# Patient Record
Sex: Male | Born: 1990 | Race: Black or African American | Hispanic: No | Marital: Single | Smoking: Current every day smoker
Health system: Southern US, Community
[De-identification: ages and names within clinical notes are randomized; demographics above are authoritative.]

## PROBLEM LIST (undated history)

## (undated) HISTORY — PX: BACK SURGERY: SHX140

---

## 2015-01-27 ENCOUNTER — Emergency Department (HOSPITAL_COMMUNITY): Payer: BC Managed Care – PPO

## 2015-01-27 ENCOUNTER — Emergency Department (HOSPITAL_COMMUNITY)
Admission: EM | Admit: 2015-01-27 | Discharge: 2015-01-27 | Disposition: A | Discharge status: Home / Self Care | Payer: BC Managed Care – PPO | Attending: Emergency Medicine | Admitting: Emergency Medicine

## 2015-01-27 ENCOUNTER — Encounter (HOSPITAL_COMMUNITY): Payer: Self-pay

## 2015-01-27 DIAGNOSIS — R109 Unspecified abdominal pain: Secondary | ICD-10-CM

## 2015-01-27 DIAGNOSIS — R1031 Right lower quadrant pain: Secondary | ICD-10-CM | POA: Diagnosis present

## 2015-01-27 DIAGNOSIS — Z87891 Personal history of nicotine dependence: Secondary | ICD-10-CM | POA: Diagnosis not present

## 2015-01-27 DIAGNOSIS — N201 Calculus of ureter: Secondary | ICD-10-CM | POA: Diagnosis not present

## 2015-01-27 LAB — COMPREHENSIVE METABOLIC PANEL
ALT: 49 U/L (ref 17–63)
AST: 29 U/L (ref 15–41)
Albumin: 3.7 g/dL (ref 3.5–5.0)
Alkaline Phosphatase: 58 U/L (ref 38–126)
Anion gap: 13 (ref 5–15)
BUN: 14 mg/dL (ref 6–20)
CHLORIDE: 102 mmol/L (ref 101–111)
CO2: 24 mmol/L (ref 22–32)
Calcium: 9.1 mg/dL (ref 8.9–10.3)
Creatinine, Ser: 1.23 mg/dL (ref 0.61–1.24)
Glucose, Bld: 123 mg/dL — ABNORMAL HIGH (ref 65–99)
POTASSIUM: 3.5 mmol/L (ref 3.5–5.1)
SODIUM: 139 mmol/L (ref 135–145)
Total Bilirubin: 0.4 mg/dL (ref 0.3–1.2)
Total Protein: 6.7 g/dL (ref 6.5–8.1)

## 2015-01-27 LAB — LIPASE, BLOOD: LIPASE: 28 U/L (ref 22–51)

## 2015-01-27 LAB — CBC
HEMATOCRIT: 43.8 % (ref 39.0–52.0)
Hemoglobin: 14.8 g/dL (ref 13.0–17.0)
MCH: 31.1 pg (ref 26.0–34.0)
MCHC: 33.8 g/dL (ref 30.0–36.0)
MCV: 92 fL (ref 78.0–100.0)
Platelets: 181 10*3/uL (ref 150–400)
RBC: 4.76 MIL/uL (ref 4.22–5.81)
RDW: 12.4 % (ref 11.5–15.5)
WBC: 5.8 10*3/uL (ref 4.0–10.5)

## 2015-01-27 MED ORDER — OXYCODONE-ACETAMINOPHEN 5-325 MG PO TABS: 1.0000 | ORAL_TABLET | Freq: Four times a day (QID) | ORAL | Status: DC | PRN

## 2015-01-27 MED ORDER — TAMSULOSIN HCL 0.4 MG PO CAPS: 0.4000 mg | ORAL_CAPSULE | Freq: Every day | ORAL | Status: DC

## 2015-01-27 MED ORDER — HYDROMORPHONE HCL 1 MG/ML IJ SOLN
1.0000 mg | Freq: Once | INTRAMUSCULAR | Status: AC
  Administered 2015-01-27: 1 mg via INTRAVENOUS
  Filled 2015-01-27: qty 1

## 2015-01-27 MED ORDER — KETOROLAC TROMETHAMINE 30 MG/ML IJ SOLN
30.0000 mg | Freq: Once | INTRAMUSCULAR | Status: AC
  Administered 2015-01-27: 30 mg via INTRAVENOUS
  Filled 2015-01-27: qty 1

## 2015-01-27 NOTE — ED Notes (Signed)
Per pt he started having RLQ pain around 4 am. Pt states he was sleeping when the pain woke him up. Pt took equate but did not have any pain relief. No vomiting or diarrhea.

## 2015-01-27 NOTE — ED Notes (Signed)
Pt attempted to provide urine specimen, was unable to

## 2015-01-27 NOTE — ED Notes (Signed)
Patient transported to CT 

## 2015-01-27 NOTE — Discharge Instructions (Signed)
Return here as needed.  Follow up with the urologist provided.  Increase your fluid intake °

## 2015-01-27 NOTE — ED Provider Notes (Signed)
CSN: [id number]     Arrival date & time 01/27/15  0551 History   First MD Initiated Contact with Patient 01/27/15 [phone]     Chief Complaint  Patient presents with  . Abdominal Pain     (Consider location/radiation/quality/duration/timing/severity/associated sxs/prior Treatment) HPI Patient presents to the emergency department with sudden onset right flank pain that started just before a.m. the patient states that he was sleeping and the pain awoke him.  He states that he did not have any nausea, vomiting, diarrhea, chest pain, shortness of breath, fever, dysuria, incontinence, weakness, dizziness, blurred vision, headache, or syncope.  The patient states the pain did seem to radiate to his back somewhat.  Patient did not take any medications prior to arrival History reviewed. No pertinent past medical history. Past Surgical History  Procedure Laterality Date  . Back surgery      Scoliosis    No family history on file. Social History  Substance Use Topics  . Smoking status: Former Smoker    Quit date: 01/27/2012  . Smokeless tobacco: Never Used  . Alcohol Use: No    Review of Systems  All other systems negative except as documented in the HPI. All pertinent positives and negatives as reviewed in the HPI.   Allergies  Pollen extract  Home Medications   Prior to Admission medications   Not on File   BP 136/88 mmHg  Pulse 71  Temp(Src) 98.6 F (37 C) (Oral)  Resp 20  SpO2 98% Physical Exam  Constitutional: He is oriented to person, place, and time. He appears well-developed and well-nourished. No distress.  HENT:  Head: Normocephalic and atraumatic.  Mouth/Throat: Oropharynx is clear and moist.  Eyes: Pupils are equal, round, and reactive to light.  Neck: Normal range of motion. Neck supple.  Cardiovascular: Normal rate, regular rhythm and normal heart sounds.  Exam reveals no gallop and no friction rub.   No murmur heard. Pulmonary/Chest: Effort normal and breath  sounds normal. No respiratory distress. He has no wheezes.  Abdominal: Soft. Bowel sounds are normal. He exhibits no distension. There is no tenderness.  Neurological: He is alert and oriented to person, place, and time. He exhibits normal muscle tone. Coordination normal.  Skin: Skin is warm and dry. No rash noted. No erythema.  Psychiatric: He has a normal mood and affect. His behavior is normal.  Nursing note and vitals reviewed.   ED Course  Procedures (including critical care time) Labs Review Labs Reviewed  COMPREHENSIVE METABOLIC PANEL - Abnormal; Notable for the following:    Glucose, Bld 123 (*)    All other components within normal limits  LIPASE, BLOOD  CBC    Imaging Review Ct Renal Stone Study  01/27/2015   CLINICAL DATA:  24 year old male with right flank/ lower quadrant pain. Difficulty urinating. Initial encounter.  EXAM: CT ABDOMEN AND PELVIS WITHOUT CONTRAST  TECHNIQUE: Multidetector CT imaging of the abdomen and pelvis was performed following the standard protocol without IV contrast.  COMPARISON:  None.  FINDINGS: 4 mm distal right ureteral obstructing stone located 2 cm proximal to the right ureteral vesicle junction causing mild to slightly moderate right-sided hydroureteronephrosis.  No extra luminal bowel inflammatory process, free fluid or free air. Specifically, no inflammation surrounds the appendix.  Two small calcified gallstones. By CT, gallbladder does not appear inflamed.  Suggestion of mild fatty infiltration of the liver.  Taking into account limitation by non contrast imaging, no focal worrisome hepatic, splenic, pancreatic, renal or adrenal lesion. Lack  of contrast and streak artifact limit evaluation.  Scoliosis convex to the right treated with C -D rods incompletely imaged on present exam.  No adenopathy or abdominal aortic aneurysm.  Lung bases are clear.  IMPRESSION: 4 mm distal right ureteral obstructing stone located 2 cm proximal to the right ureteral  vesicle junction causing mild to slightly moderate right-sided hydroureteronephrosis.  No extra luminal bowel inflammatory process, free fluid or free air. Specifically, no inflammation surrounds the appendix.  Two small calcified gallstones. By CT, gallbladder does not appear inflamed.  Suggestion of mild fatty infiltration of the liver.   Electronically Signed   By: Lacy Duverney M.D.   On: 01/27/2015 07:32   I have personally reviewed and evaluated these images and lab results as part of my medical decision-making.  Patient was given medications that have reduced his pain significantly.  He does not have any current pain at this time.  He will be referred to urology.  Told to return here as needed.  Patient is treated for ureteral stone.  He is advised to increase his fluid intake    Charlestine Night, PA-C 01/29/15 0149  Richardean Canal, MD 01/29/15 1723

## 2016-10-15 IMAGING — CT CT RENAL STONE PROTOCOL
2 of 4 series · 17 of 46 positions shown, 19 images · non-contrast
Comparison: None.

CLINICAL DATA: 24-year-old male with right flank/ lower quadrant
pain. Difficulty urinating. Initial encounter.

EXAM:
CT ABDOMEN AND PELVIS WITHOUT CONTRAST
TECHNIQUE: Multidetector CT imaging of the abdomen and pelvis was performed
following the standard protocol without IV contrast.

[Series 2: stone study 5.0 i30f 1 · axial · 0.79mm/px · z∈[-487,-67]mm · 14 of 94 slices shown, 16 images]
[im 5/94  soft-tissue]
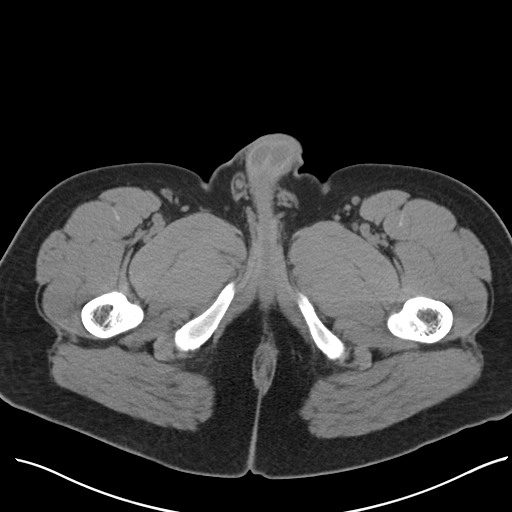
[im 5/94  bone]
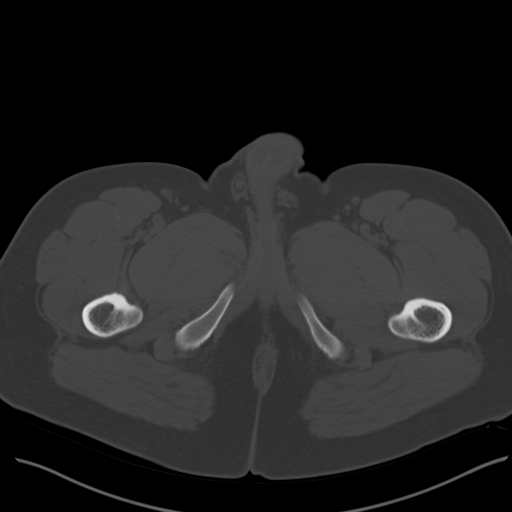
[im 13/94  soft-tissue]
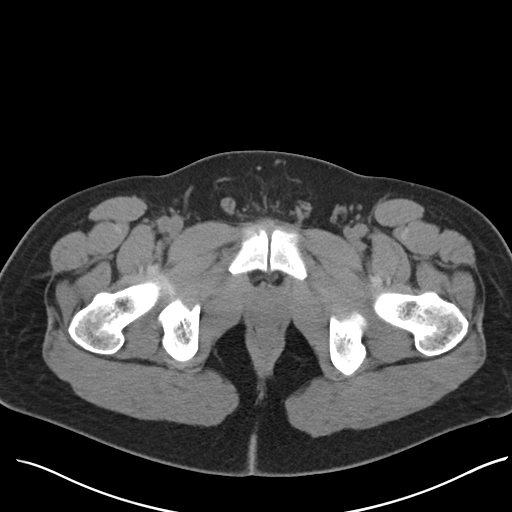
[im 17/94  soft-tissue]
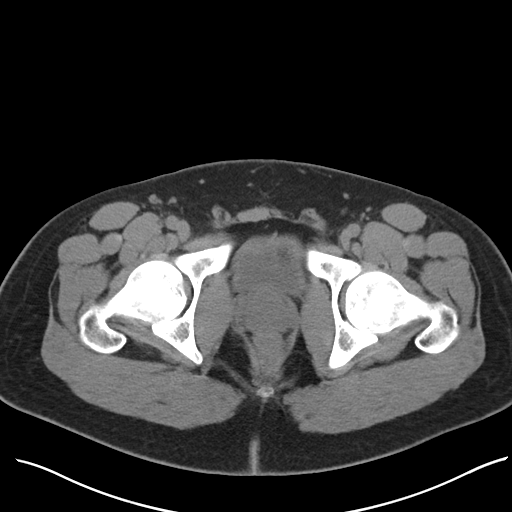
[im 25/94  soft-tissue]
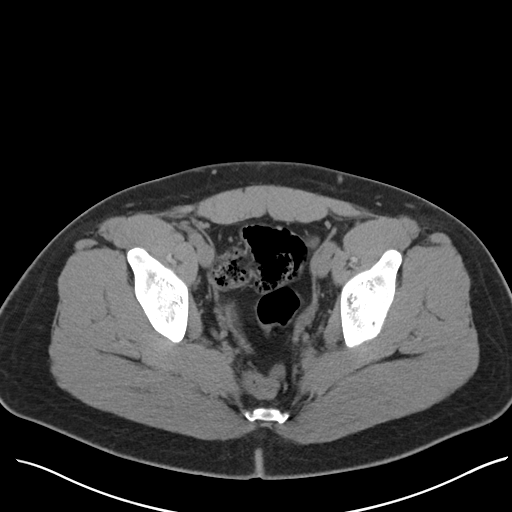
[im 33/94  soft-tissue]
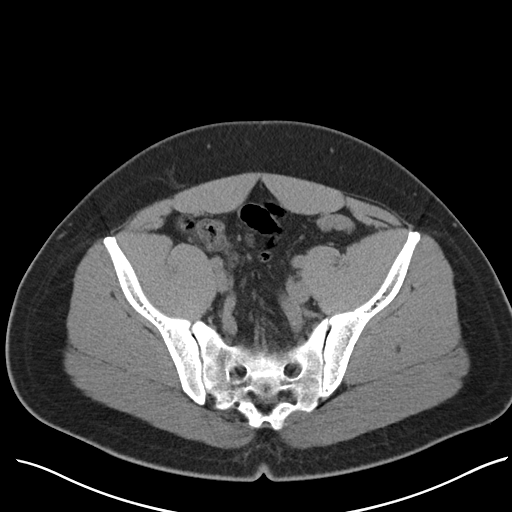
[im 37/94  soft-tissue]
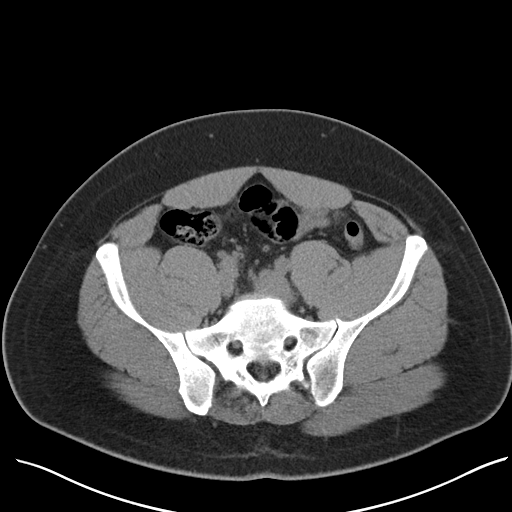
[im 45/94  soft-tissue]
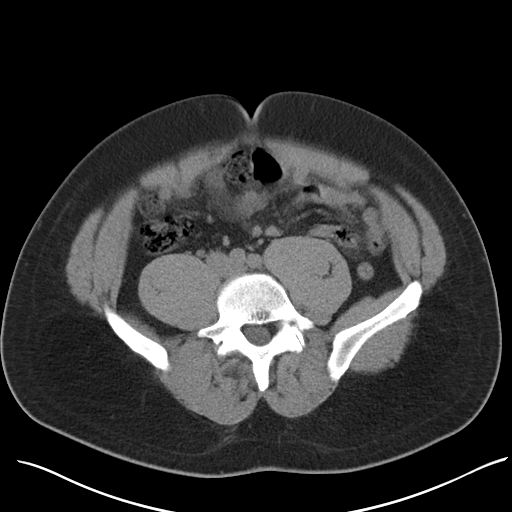
[im 49/94  soft-tissue]
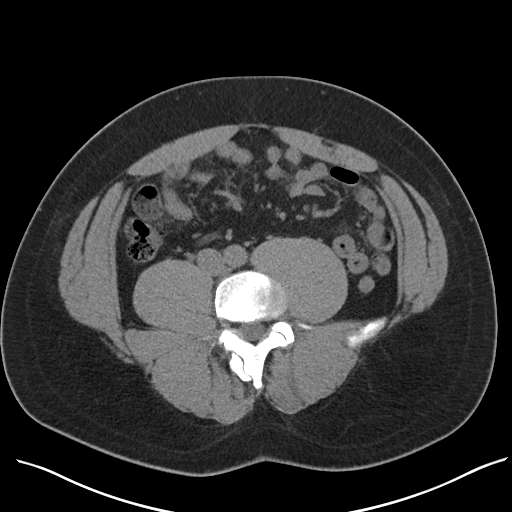
[im 57/94  soft-tissue]
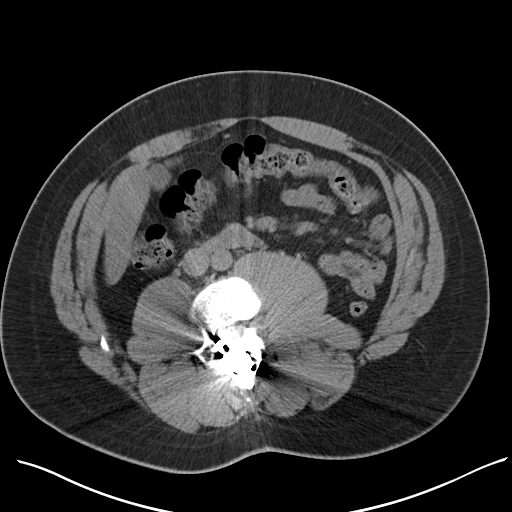
[im 57/94  bone]
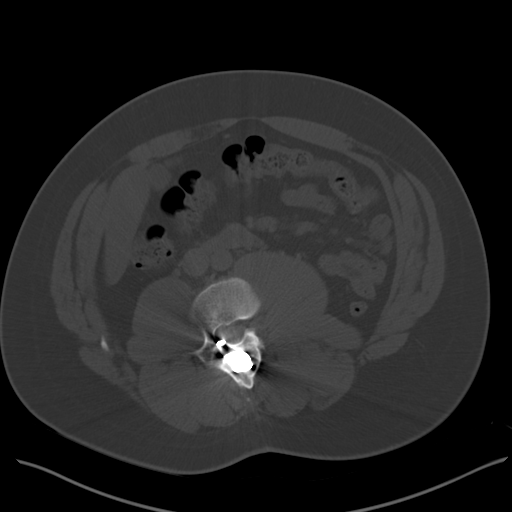
[im 61/94  soft-tissue]
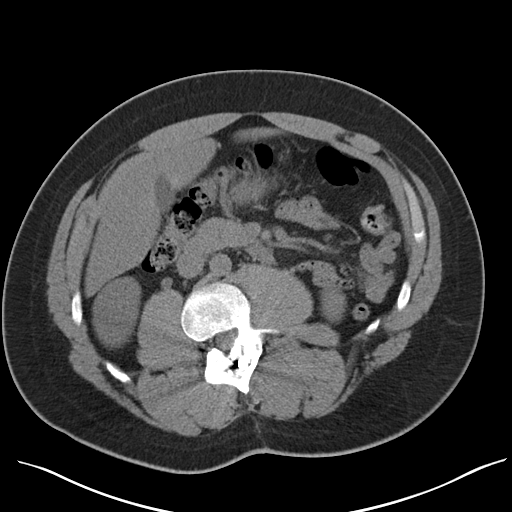
[im 69/94  soft-tissue]
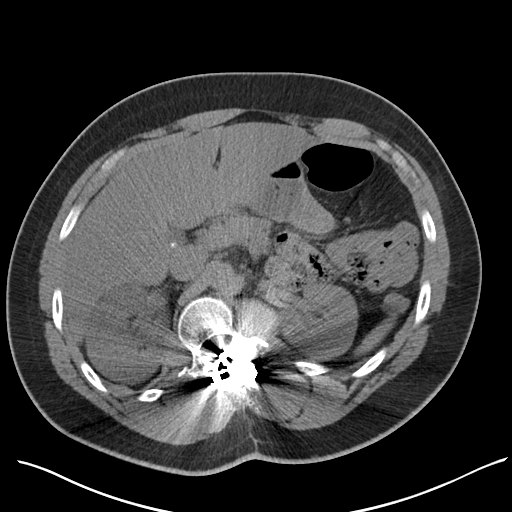
[im 77/94  soft-tissue]
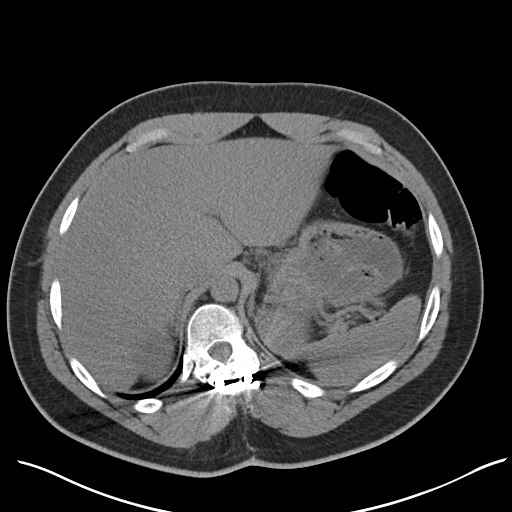
[im 81/94  soft-tissue]
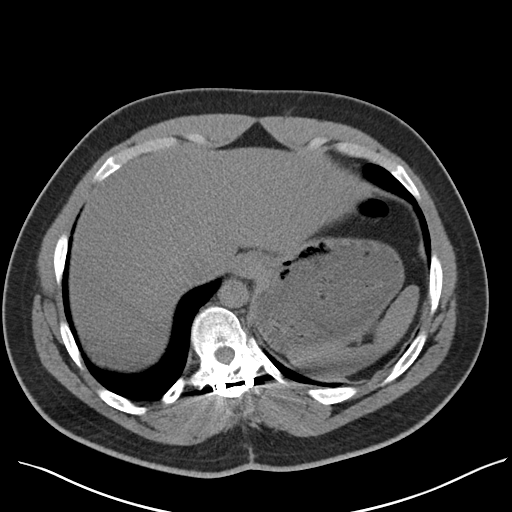
[im 89/94  soft-tissue]
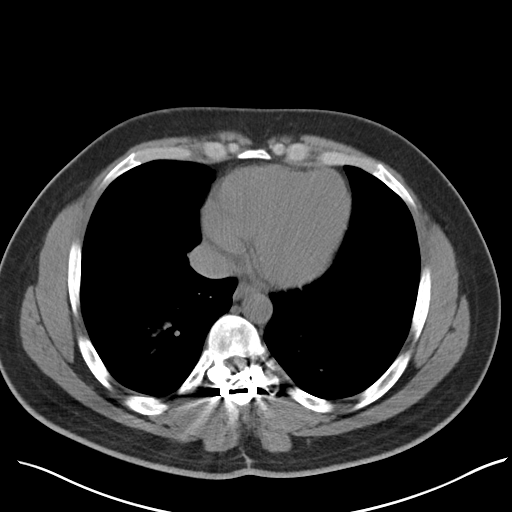

[Series 5: coronal soft tissue · coronal · 0.85mm/px · 3 of 99 slices shown]
[im 33/99  soft-tissue]
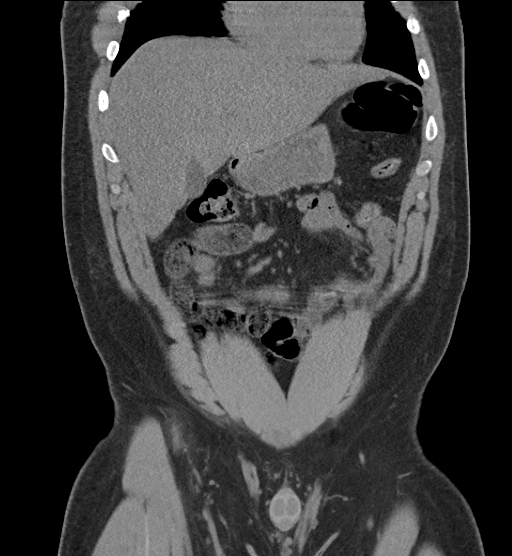
[im 44/99  soft-tissue]
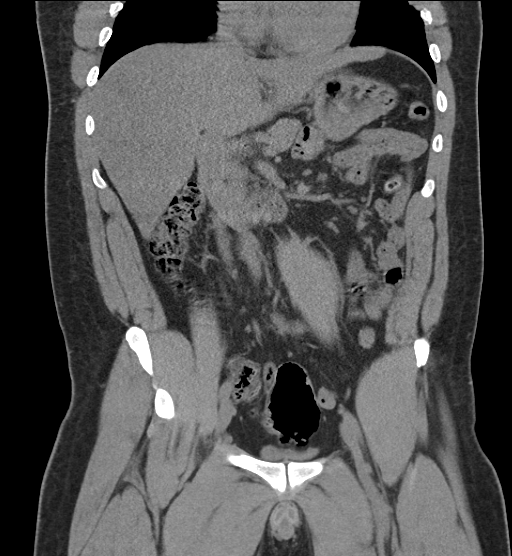
[im 55/99  soft-tissue]
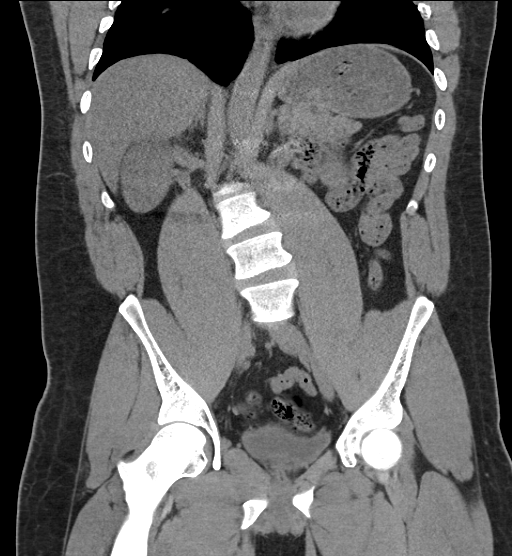

[17 of 46 positions shown; findings below may reference images not displayed]

FINDINGS: 4 mm distal right ureteral obstructing stone located 2 cm proximal
to the right ureteral vesicle junction causing mild to slightly
moderate right-sided hydroureteronephrosis.

No extra luminal bowel inflammatory process, free fluid or free air.
Specifically, no inflammation surrounds the appendix.

Two small calcified gallstones. By CT, gallbladder does not appear
inflamed.

Suggestion of mild fatty infiltration of the liver.

Taking into account limitation by non contrast imaging, no focal
worrisome hepatic, splenic, pancreatic, renal or adrenal lesion.
Lack of contrast and streak artifact limit evaluation.

Scoliosis convex to the right treated with C -D rods incompletely
imaged on present exam.

No adenopathy or abdominal aortic aneurysm.  Lung bases are clear.
IMPRESSION: 4 mm distal right ureteral obstructing stone located 2 cm proximal
to the right ureteral vesicle junction causing mild to slightly
moderate right-sided hydroureteronephrosis.

No extra luminal bowel inflammatory process, free fluid or free air.
Specifically, no inflammation surrounds the appendix.

Two small calcified gallstones. By CT, gallbladder does not appear
inflamed.

Suggestion of mild fatty infiltration of the liver.

## 2017-06-13 ENCOUNTER — Encounter: Payer: Self-pay | Admitting: Physician Assistant

## 2017-06-13 ENCOUNTER — Ambulatory Visit: Payer: Self-pay | Admitting: Physician Assistant

## 2017-06-13 ENCOUNTER — Other Ambulatory Visit: Payer: Self-pay

## 2017-06-13 VITALS — BP 126/82 | HR 74 | Temp 98.5°F | Resp 18 | Ht 73.0 in | Wt 259.0 lb

## 2017-06-13 DIAGNOSIS — R599 Enlarged lymph nodes, unspecified: Secondary | ICD-10-CM

## 2017-06-13 DIAGNOSIS — R0981 Nasal congestion: Secondary | ICD-10-CM

## 2017-06-13 MED ORDER — FLUTICASONE PROPIONATE 50 MCG/ACT NA SUSP: 2.0000 | Freq: Every day | NASAL | 6 refills | Status: DC

## 2017-06-13 NOTE — Progress Notes (Signed)
Chad NickelJoshua Mendez  MRN: [id number] DOB: 06/02/1990  PCP: Patient, No Pcp Per  Chief Complaint  Patient presents with  . Neck Pain    neck is very swollen, hard to talk and swallow ears and jaw also hurts started this morning   . Depression    screening was a 4     Subjective:  Pt presents to clinic for symptoms that started this am.  Woke up with the right side of his neck, throat and ear all hurting.  Some visible swelling this am.  Slightly improved pain due to motrin intake about an hour ago.  He has a scratchy throat.  No left side pain. No congestion or cough.  He did have some cold symptoms last week with the weather change but he felt well from that - past night he was fine.    He works as a Environmental managercomedian.  No injury to neck known.  History is obtained by patient.  Review of Systems  Constitutional: Negative for chills and fever.  HENT: Positive for ear pain (only with swallowing), sore throat, trouble swallowing (pain in neck) and voice change. Negative for congestion, dental problem, ear discharge and postnasal drip.   Musculoskeletal: Negative for myalgias.  Neurological: Negative for facial asymmetry.    There are no active problems to display for this patient.   No current outpatient medications on file prior to visit.   No current facility-administered medications on file prior to visit.     Allergies  Allergen Reactions  . Pollen Extract     History reviewed. No pertinent past medical history. Social History   Social History Narrative  . Not on file   Social History   Tobacco Use  . Smoking status: Current Every Day Smoker    Types: Cigarettes    Last attempt to quit: 01/27/2012    Years since quitting: 5.3  . Smokeless tobacco: Never Used  Substance Use Topics  . Alcohol use: No  . Drug use: Yes    Types: Marijuana    Comment: Last use was 3 days ago    family history is not on file.     Objective:  BP 126/82   Pulse 74   Temp 98.5 F  (36.9 C) (Oral)   Resp 18   Ht 6\' 1"  (1.854 m)   Wt 259 lb (117.5 kg)   SpO2 100%   BMI 34.17 kg/m  Body mass index is 34.17 kg/m.  Physical Exam  Constitutional: He is oriented to person, place, and time and well-developed, well-nourished, and in no distress.  HENT:  Head: Normocephalic and atraumatic.  Right Ear: Hearing, external ear and ear canal normal. A middle ear effusion (mild - serous fluid) is present.  Left Ear: Hearing, tympanic membrane, external ear and ear canal normal.  Nose: Mucosal edema (red and swollen) present.  Mouth/Throat: Uvula is midline, oropharynx is clear and moist and mucous membranes are normal. Normal dentition. No dental abscesses or uvula swelling. No oropharyngeal exudate, posterior oropharyngeal edema or posterior oropharyngeal erythema.  Eyes: Conjunctivae are normal.  Neck: Normal range of motion.  Cardiovascular: Normal rate, regular rhythm and normal heart sounds.  Pulmonary/Chest: Effort normal and breath sounds normal. He has no wheezes.  Lymphadenopathy:       Head (right side): Tonsillar (tender - mild enlargement) adenopathy present. No occipital adenopathy present.       Head (left side): No tonsillar and no occipital adenopathy present.    He has no cervical  adenopathy.       Right cervical: No superficial cervical adenopathy present.      Left cervical: No superficial cervical adenopathy present.       Right: No supraclavicular adenopathy present.       Left: No supraclavicular adenopathy present.  Neurological: He is alert and oriented to person, place, and time. Gait normal.  Skin: Skin is warm and dry. No rash noted.  No lesions - some scarring present  Psychiatric: Mood, memory, affect and judgment normal.    Assessment and Plan :  Nose congestion - Plan: fluticasone (FLONASE) 50 MCG/ACT nasal spray  Swollen lymph nodes   Likely pt is getting ready to develop URI symptoms.  Symptomatic treatment d/w pt.  Follow the lymph  node and RTC in 2 weeks if no improvement.  Benny Lennert PA-C  Primary Care at [facility] 06/13/2017 10:24 AM

## 2017-06-13 NOTE — Patient Instructions (Addendum)
  Mucinex - blue box to help make your nose run - drink a lot of water Motrin - 200mg  - take 4 pills every 8 hours for pain -     IF you received an x-ray today, you will receive an invoice from [facility]. Please contact [facility] at [phone] with questions or concerns regarding your invoice.   IF you received labwork today, you will receive an invoice from [facility]. Please contact [facility] at [phone] with questions or concerns regarding your invoice.   Our billing staff will not be able to assist you with questions regarding bills from these companies.  You will be contacted with the lab results as soon as they are available. The fastest way to get your results is to activate your My Chart account. Instructions are located on the last page of this paperwork. If you have not heard from us regarding the results in 2 weeks, please contact this office.

## 2021-10-14 ENCOUNTER — Ambulatory Visit (HOSPITAL_COMMUNITY)
Admission: EM | Admit: 2021-10-14 | Discharge: 2021-10-14 | Disposition: A | Discharge status: Home / Self Care | Payer: Self-pay | Attending: Urgent Care | Admitting: Urgent Care

## 2021-10-14 ENCOUNTER — Encounter (HOSPITAL_COMMUNITY): Payer: Self-pay | Admitting: Emergency Medicine

## 2021-10-14 ENCOUNTER — Ambulatory Visit (HOSPITAL_COMMUNITY): Payer: Self-pay

## 2021-10-14 DIAGNOSIS — H5712 Ocular pain, left eye: Secondary | ICD-10-CM

## 2021-10-14 MED ORDER — TETRACAINE HCL 0.5 % OP SOLN
OPHTHALMIC | Status: AC
  Filled 2021-10-14: qty 4

## 2021-10-14 MED ORDER — ERYTHROMYCIN 5 MG/GM OP OINT: TOPICAL_OINTMENT | OPHTHALMIC | 0 refills | Status: AC

## 2021-10-14 MED ORDER — FLUORESCEIN SODIUM 1 MG OP STRP
ORAL_STRIP | OPHTHALMIC | Status: AC
  Filled 2021-10-14: qty 1
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
## 2021-10-14 NOTE — Discharge Instructions (Addendum)
Your fluorescein stain was normal. Please take 800mg  ibuprofen every 8 hours with food. Start the erythromycin ointment today, and follow up with Dr. first thing tomorrow morning. If pain increases during the night or if your vision becomes blurred, head to ER immediately.
